# Patient Record
Sex: Female | Born: 1935 | Race: White | Hispanic: No
Health system: Southern US, Community
[De-identification: ages and names within clinical notes are randomized; demographics above are authoritative.]

---

## 2004-04-11 ENCOUNTER — Ambulatory Visit: Payer: Self-pay | Admitting: Oncology

## 2004-09-19 ENCOUNTER — Ambulatory Visit: Payer: Self-pay | Admitting: Oncology

## 2004-10-15 ENCOUNTER — Ambulatory Visit: Payer: Self-pay | Admitting: Oncology

## 2005-04-14 ENCOUNTER — Ambulatory Visit: Payer: Self-pay | Admitting: Oncology

## 2005-04-17 ENCOUNTER — Ambulatory Visit: Payer: Self-pay | Admitting: Oncology

## 2005-07-23 ENCOUNTER — Other Ambulatory Visit: Payer: Self-pay

## 2005-07-23 ENCOUNTER — Emergency Department: Payer: Self-pay | Admitting: Emergency Medicine

## 2005-09-19 ENCOUNTER — Ambulatory Visit: Payer: Self-pay | Admitting: Oncology

## 2005-12-17 ENCOUNTER — Emergency Department: Payer: Self-pay | Admitting: Emergency Medicine

## 2005-12-27 ENCOUNTER — Emergency Department: Payer: Self-pay | Admitting: Unknown Physician Specialty

## 2006-03-07 ENCOUNTER — Emergency Department: Payer: Self-pay | Admitting: Emergency Medicine

## 2006-03-07 ENCOUNTER — Other Ambulatory Visit: Payer: Self-pay

## 2006-04-16 ENCOUNTER — Emergency Department: Payer: Self-pay | Admitting: Internal Medicine

## 2006-05-16 ENCOUNTER — Emergency Department: Payer: Self-pay | Admitting: Emergency Medicine

## 2006-10-16 ENCOUNTER — Ambulatory Visit: Payer: Self-pay | Admitting: Oncology

## 2006-10-26 ENCOUNTER — Ambulatory Visit: Payer: Self-pay | Admitting: Oncology

## 2007-04-18 ENCOUNTER — Ambulatory Visit: Payer: Self-pay | Admitting: Oncology

## 2007-05-16 ENCOUNTER — Ambulatory Visit: Payer: Self-pay | Admitting: Oncology

## 2007-11-11 ENCOUNTER — Ambulatory Visit: Payer: Self-pay | Admitting: Oncology

## 2007-11-16 ENCOUNTER — Ambulatory Visit: Payer: Self-pay | Admitting: Oncology

## 2008-04-17 ENCOUNTER — Ambulatory Visit: Payer: Self-pay | Admitting: Oncology

## 2008-05-04 ENCOUNTER — Ambulatory Visit: Payer: Self-pay | Admitting: Oncology

## 2008-05-11 ENCOUNTER — Ambulatory Visit: Payer: Self-pay | Admitting: Oncology

## 2008-05-15 ENCOUNTER — Ambulatory Visit: Payer: Self-pay | Admitting: Oncology

## 2008-10-15 ENCOUNTER — Ambulatory Visit: Payer: Self-pay | Admitting: Oncology

## 2008-11-08 ENCOUNTER — Ambulatory Visit: Payer: Self-pay | Admitting: Oncology

## 2008-11-15 ENCOUNTER — Ambulatory Visit: Payer: Self-pay | Admitting: Oncology

## 2009-06-15 ENCOUNTER — Ambulatory Visit: Payer: Self-pay | Admitting: Oncology

## 2009-07-05 ENCOUNTER — Ambulatory Visit: Payer: Self-pay | Admitting: Oncology

## 2009-07-12 ENCOUNTER — Ambulatory Visit: Payer: Self-pay | Admitting: Oncology

## 2010-01-22 ENCOUNTER — Ambulatory Visit: Payer: Self-pay | Admitting: Oncology

## 2010-02-14 ENCOUNTER — Ambulatory Visit: Payer: Self-pay | Admitting: Oncology

## 2010-09-19 ENCOUNTER — Ambulatory Visit: Payer: Self-pay | Admitting: Oncology

## 2010-10-16 ENCOUNTER — Ambulatory Visit: Payer: Self-pay | Admitting: Oncology

## 2011-03-28 ENCOUNTER — Emergency Department: Payer: Self-pay | Admitting: Emergency Medicine

## 2011-03-30 ENCOUNTER — Emergency Department: Payer: Self-pay | Admitting: *Deleted

## 2011-05-14 ENCOUNTER — Inpatient Hospital Stay: Payer: Self-pay | Admitting: Internal Medicine

## 2011-05-14 LAB — URINALYSIS, COMPLETE
Blood: NEGATIVE
Glucose,UR: NEGATIVE mg/dL (ref 0–75)
Leukocyte Esterase: NEGATIVE
Nitrite: NEGATIVE
Ph: 8 (ref 4.5–8.0)
Specific Gravity: 1.01 (ref 1.003–1.030)
Squamous Epithelial: NONE SEEN
WBC UR: NONE SEEN /HPF (ref 0–5)

## 2011-05-14 LAB — COMPREHENSIVE METABOLIC PANEL
Anion Gap: 16 (ref 7–16)
BUN: 10 mg/dL (ref 7–18)
Bilirubin,Total: 0.5 mg/dL (ref 0.2–1.0)
Chloride: 81 mmol/L — ABNORMAL LOW (ref 98–107)
Creatinine: 0.72 mg/dL (ref 0.60–1.30)
EGFR (African American): >60
EGFR (Non-African Amer.): >60
Glucose: 183 mg/dL — ABNORMAL HIGH (ref 65–99)
Osmolality: 255 (ref 275–301)
Potassium: 2.9 mmol/L — ABNORMAL LOW (ref 3.5–5.1)
SGPT (ALT): 16 U/L
Sodium: 125 mmol/L — ABNORMAL LOW (ref 136–145)
Total Protein: 7.9 g/dL (ref 6.4–8.2)

## 2011-05-14 LAB — CBC
HCT: 41.5 % (ref 35.0–47.0)
HGB: 14.3 g/dL (ref 12.0–16.0)
MCH: 30.8 pg (ref 26.0–34.0)
MCHC: 34.5 g/dL (ref 32.0–36.0)
Platelet: 327 10*3/uL (ref 150–440)
RDW: 13.6 % (ref 11.5–14.5)

## 2011-05-14 LAB — BASIC METABOLIC PANEL
Anion Gap: 12 (ref 7–16)
BUN: 7 mg/dL (ref 7–18)
Chloride: 90 mmol/L — ABNORMAL LOW (ref 98–107)
EGFR (Non-African Amer.): >60
Potassium: 3.5 mmol/L (ref 3.5–5.1)

## 2011-05-15 LAB — BASIC METABOLIC PANEL
Anion Gap: 12 (ref 7–16)
Anion Gap: 12 (ref 7–16)
BUN: 6 mg/dL — ABNORMAL LOW (ref 7–18)
BUN: 5 mg/dL — ABNORMAL LOW (ref 7–18)
BUN: 6 mg/dL — ABNORMAL LOW (ref 7–18)
Calcium, Total: 9.5 mg/dL (ref 8.5–10.1)
Calcium, Total: 8.7 mg/dL (ref 8.5–10.1)
Chloride: 96 mmol/L — ABNORMAL LOW (ref 98–107)
Chloride: 98 mmol/L (ref 98–107)
Co2: 27 mmol/L (ref 21–32)
Co2: 27 mmol/L (ref 21–32)
Creatinine: 0.65 mg/dL (ref 0.60–1.30)
Creatinine: 0.58 mg/dL — ABNORMAL LOW (ref 0.60–1.30)
EGFR (African American): >60
EGFR (African American): >60
EGFR (Non-African Amer.): >60
Glucose: 107 mg/dL — ABNORMAL HIGH (ref 65–99)
Glucose: 113 mg/dL — ABNORMAL HIGH (ref 65–99)
Osmolality: 264 (ref 275–301)
Osmolality: 266 (ref 275–301)
Potassium: 3.3 mmol/L — ABNORMAL LOW (ref 3.5–5.1)
Potassium: 3.5 mmol/L (ref 3.5–5.1)
Potassium: 3.6 mmol/L (ref 3.5–5.1)
Sodium: 133 mmol/L — ABNORMAL LOW (ref 136–145)

## 2011-05-16 ENCOUNTER — Ambulatory Visit: Payer: Self-pay | Admitting: Internal Medicine

## 2011-05-16 LAB — CBC WITH DIFFERENTIAL/PLATELET
Basophil #: 0 10*3/uL (ref 0.0–0.1)
Eosinophil %: 0 %
HCT: 35.6 % (ref 35.0–47.0)
HGB: 12 g/dL (ref 12.0–16.0)
Lymphocyte #: 2 10*3/uL (ref 1.0–3.6)
Lymphocyte %: 20 %
Monocyte #: 0.7 10*3/uL (ref 0.0–0.7)
Monocyte %: 6.5 %
Neutrophil #: 7.4 10*3/uL — ABNORMAL HIGH (ref 1.4–6.5)
Neutrophil %: 73.3 %
Platelet: 254 10*3/uL (ref 150–440)
RDW: 13.5 % (ref 11.5–14.5)
WBC: 10.1 10*3/uL (ref 3.6–11.0)

## 2011-05-16 LAB — BASIC METABOLIC PANEL
Anion Gap: 12 (ref 7–16)
BUN: 7 mg/dL (ref 7–18)
Chloride: 102 mmol/L (ref 98–107)
Co2: 23 mmol/L (ref 21–32)
Creatinine: 0.61 mg/dL (ref 0.60–1.30)
Osmolality: 271 (ref 275–301)
Potassium: 3.5 mmol/L (ref 3.5–5.1)

## 2011-05-17 LAB — BASIC METABOLIC PANEL
Anion Gap: 14 (ref 7–16)
BUN: 14 mg/dL (ref 7–18)
Calcium, Total: 9.1 mg/dL (ref 8.5–10.1)
Chloride: 102 mmol/L (ref 98–107)
Co2: 23 mmol/L (ref 21–32)
Creatinine: 0.58 mg/dL — ABNORMAL LOW (ref 0.60–1.30)
EGFR (Non-African Amer.): >60
Sodium: 139 mmol/L (ref 136–145)

## 2011-05-18 LAB — BASIC METABOLIC PANEL
BUN: 13 mg/dL (ref 7–18)
Calcium, Total: 9 mg/dL (ref 8.5–10.1)
Co2: 21 mmol/L (ref 21–32)
Glucose: 58 mg/dL — ABNORMAL LOW (ref 65–99)
Osmolality: 275 (ref 275–301)
Potassium: 3.2 mmol/L — ABNORMAL LOW (ref 3.5–5.1)
Sodium: 139 mmol/L (ref 136–145)

## 2011-06-01 ENCOUNTER — Inpatient Hospital Stay: Payer: Self-pay | Admitting: Internal Medicine

## 2011-06-01 LAB — URINALYSIS, COMPLETE
Glucose,UR: NEGATIVE mg/dL (ref 0–75)
Nitrite: POSITIVE
Ph: 8 (ref 4.5–8.0)
Protein: 100
RBC,UR: 18 /HPF (ref 0–5)
Specific Gravity: 1.012 (ref 1.003–1.030)
WBC UR: 147 /HPF (ref 0–5)

## 2011-06-01 LAB — CBC
HCT: 41.3 % (ref 35.0–47.0)
MCH: 30.7 pg (ref 26.0–34.0)
MCHC: 33.4 g/dL (ref 32.0–36.0)
RBC: 4.5 10*6/uL (ref 3.80–5.20)

## 2011-06-01 LAB — COMPREHENSIVE METABOLIC PANEL
Albumin: 2.6 g/dL — ABNORMAL LOW (ref 3.4–5.0)
Bilirubin,Total: 0.9 mg/dL (ref 0.2–1.0)
Calcium, Total: 9.5 mg/dL (ref 8.5–10.1)
EGFR (African American): 49 — ABNORMAL LOW
Glucose: 130 mg/dL — ABNORMAL HIGH (ref 65–99)
Osmolality: 334 (ref 275–301)
Potassium: 3.7 mmol/L (ref 3.5–5.1)
SGOT(AST): 11 U/L — ABNORMAL LOW (ref 15–37)
SGPT (ALT): 17 U/L
Sodium: 153 mmol/L — ABNORMAL HIGH (ref 136–145)

## 2011-06-01 LAB — BASIC METABOLIC PANEL
BUN: 81 mg/dL — ABNORMAL HIGH (ref 7–18)
Calcium, Total: 9.3 mg/dL (ref 8.5–10.1)
Chloride: 120 mmol/L — ABNORMAL HIGH (ref 98–107)
EGFR (Non-African Amer.): 51 — ABNORMAL LOW
Glucose: 135 mg/dL — ABNORMAL HIGH (ref 65–99)
Osmolality: 324 (ref 275–301)
Potassium: 3.4 mmol/L — ABNORMAL LOW (ref 3.5–5.1)
Sodium: 150 mmol/L — ABNORMAL HIGH (ref 136–145)

## 2011-06-02 LAB — CBC WITH DIFFERENTIAL/PLATELET
Basophil #: 0 10*3/uL (ref 0.0–0.1)
Basophil %: 0.1 %
Eosinophil #: 0 10*3/uL (ref 0.0–0.7)
HCT: 36.4 % (ref 35.0–47.0)
HGB: 12.1 g/dL (ref 12.0–16.0)
Lymphocyte #: 0.8 10*3/uL — ABNORMAL LOW (ref 1.0–3.6)
MCH: 30.3 pg (ref 26.0–34.0)
MCHC: 33.2 g/dL (ref 32.0–36.0)
MCV: 91 fL (ref 80–100)
Monocyte #: 0.3 10*3/uL (ref 0.0–0.7)
Neutrophil #: 14.1 10*3/uL — ABNORMAL HIGH (ref 1.4–6.5)

## 2011-06-02 LAB — BASIC METABOLIC PANEL
Anion Gap: 11 (ref 7–16)
BUN: 66 mg/dL — ABNORMAL HIGH (ref 7–18)
Chloride: 121 mmol/L — ABNORMAL HIGH (ref 98–107)
Creatinine: 1.05 mg/dL (ref 0.60–1.30)
Osmolality: 330 (ref 275–301)
Potassium: 3.3 mmol/L — ABNORMAL LOW (ref 3.5–5.1)
Sodium: 156 mmol/L — ABNORMAL HIGH (ref 136–145)

## 2011-06-03 LAB — CBC WITH DIFFERENTIAL/PLATELET
Basophil #: 0 10*3/uL (ref 0.0–0.1)
Eosinophil #: 0 10*3/uL (ref 0.0–0.7)
Eosinophil %: 0.2 %
HCT: 36 % (ref 35.0–47.0)
HGB: 12 g/dL (ref 12.0–16.0)
Lymphocyte #: 0.9 10*3/uL — ABNORMAL LOW (ref 1.0–3.6)
Lymphocyte %: 5.2 %
MCV: 91 fL (ref 80–100)
Monocyte %: 1.8 %
Neutrophil #: 15.4 10*3/uL — ABNORMAL HIGH (ref 1.4–6.5)
Platelet: 387 10*3/uL (ref 150–440)
RDW: 15.6 % — ABNORMAL HIGH (ref 11.5–14.5)

## 2011-06-03 LAB — BASIC METABOLIC PANEL
Anion Gap: 13 (ref 7–16)
BUN: 52 mg/dL — ABNORMAL HIGH (ref 7–18)
Calcium, Total: 9.1 mg/dL (ref 8.5–10.1)
Co2: 23 mmol/L (ref 21–32)
Creatinine: 0.96 mg/dL (ref 0.60–1.30)
EGFR (African American): >60
EGFR (Non-African Amer.): 59 — ABNORMAL LOW
Sodium: 156 mmol/L — ABNORMAL HIGH (ref 136–145)

## 2011-06-04 LAB — CBC WITH DIFFERENTIAL/PLATELET
Basophil #: 0 10*3/uL (ref 0.0–0.1)
Eosinophil #: 0.1 10*3/uL (ref 0.0–0.7)
Eosinophil %: 0.5 %
HGB: 11.7 g/dL — ABNORMAL LOW (ref 12.0–16.0)
Lymphocyte #: 1 10*3/uL (ref 1.0–3.6)
MCV: 91 fL (ref 80–100)
Monocyte #: 0.4 10*3/uL (ref 0.0–0.7)
Monocyte %: 2.9 %
Neutrophil #: 10.8 10*3/uL — ABNORMAL HIGH (ref 1.4–6.5)
Neutrophil %: 88.3 %
Platelet: 362 10*3/uL (ref 150–440)
RBC: 3.94 10*6/uL (ref 3.80–5.20)
RDW: 15.5 % — ABNORMAL HIGH (ref 11.5–14.5)
WBC: 12.2 10*3/uL — ABNORMAL HIGH (ref 3.6–11.0)

## 2011-06-04 LAB — BASIC METABOLIC PANEL
Anion Gap: 8 (ref 7–16)
BUN: 33 mg/dL — ABNORMAL HIGH (ref 7–18)
Calcium, Total: 9.3 mg/dL (ref 8.5–10.1)
Chloride: 118 mmol/L — ABNORMAL HIGH (ref 98–107)
Glucose: 95 mg/dL (ref 65–99)
Osmolality: 305 (ref 275–301)
Potassium: 3.7 mmol/L (ref 3.5–5.1)

## 2011-06-05 LAB — BASIC METABOLIC PANEL
BUN: 22 mg/dL — ABNORMAL HIGH (ref 7–18)
Calcium, Total: 8.9 mg/dL (ref 8.5–10.1)
Chloride: 113 mmol/L — ABNORMAL HIGH (ref 98–107)
Creatinine: 0.66 mg/dL (ref 0.60–1.30)
EGFR (African American): >60
EGFR (Non-African Amer.): >60
Osmolality: 293 (ref 275–301)
Potassium: 3.4 mmol/L — ABNORMAL LOW (ref 3.5–5.1)
Sodium: 146 mmol/L — ABNORMAL HIGH (ref 136–145)

## 2011-06-06 LAB — CBC WITH DIFFERENTIAL/PLATELET
Basophil #: 0 10*3/uL (ref 0.0–0.1)
Basophil %: 0.1 %
Eosinophil #: 0 10*3/uL (ref 0.0–0.7)
HCT: 36 % (ref 35.0–47.0)
HGB: 12 g/dL (ref 12.0–16.0)
Lymphocyte %: 6 %
MCHC: 33.5 g/dL (ref 32.0–36.0)
Monocyte %: 1.5 %
Neutrophil #: 7.8 10*3/uL — ABNORMAL HIGH (ref 1.4–6.5)
Neutrophil %: 92.4 %
Platelet: 303 10*3/uL (ref 150–440)
RBC: 4.01 10*6/uL (ref 3.80–5.20)
RDW: 15.2 % — ABNORMAL HIGH (ref 11.5–14.5)

## 2011-06-06 LAB — BASIC METABOLIC PANEL
Anion Gap: 18 — ABNORMAL HIGH (ref 7–16)
BUN: 26 mg/dL — ABNORMAL HIGH (ref 7–18)
Calcium, Total: 8.8 mg/dL (ref 8.5–10.1)
Chloride: 110 mmol/L — ABNORMAL HIGH (ref 98–107)
Co2: 23 mmol/L (ref 21–32)
Creatinine: 0.92 mg/dL (ref 0.60–1.30)
EGFR (African American): >60
Glucose: 122 mg/dL — ABNORMAL HIGH (ref 65–99)
Potassium: 2.9 mmol/L — ABNORMAL LOW (ref 3.5–5.1)
Sodium: 151 mmol/L — ABNORMAL HIGH (ref 136–145)

## 2011-06-06 LAB — MAGNESIUM: Magnesium: 1.7 mg/dL — ABNORMAL LOW

## 2011-06-06 LAB — PHOSPHORUS: Phosphorus: 3.3 mg/dL (ref 2.5–4.9)

## 2011-06-07 LAB — URINE CULTURE

## 2011-06-07 LAB — PHOSPHORUS: Phosphorus: 2.2 mg/dL — ABNORMAL LOW (ref 2.5–4.9)

## 2011-06-07 LAB — SODIUM: Sodium: 153 mmol/L — ABNORMAL HIGH (ref 136–145)

## 2011-06-07 LAB — CHLORIDE: Chloride: 116 mmol/L — ABNORMAL HIGH (ref 98–107)

## 2011-06-08 LAB — BASIC METABOLIC PANEL
Calcium, Total: 8.6 mg/dL (ref 8.5–10.1)
Co2: 22 mmol/L (ref 21–32)
Creatinine: 0.72 mg/dL (ref 0.60–1.30)
EGFR (African American): >60
EGFR (Non-African Amer.): >60
Glucose: 113 mg/dL — ABNORMAL HIGH (ref 65–99)

## 2011-06-08 LAB — SODIUM: Sodium: 152 mmol/L — ABNORMAL HIGH (ref 136–145)

## 2011-06-08 LAB — PHOSPHORUS: Phosphorus: 1.9 mg/dL — ABNORMAL LOW (ref 2.5–4.9)

## 2011-06-09 LAB — BASIC METABOLIC PANEL
BUN: 33 mg/dL — ABNORMAL HIGH (ref 7–18)
Calcium, Total: 8.1 mg/dL — ABNORMAL LOW (ref 8.5–10.1)
Chloride: 110 mmol/L — ABNORMAL HIGH (ref 98–107)
Creatinine: 0.7 mg/dL (ref 0.60–1.30)
EGFR (Non-African Amer.): >60
Glucose: 200 mg/dL — ABNORMAL HIGH (ref 65–99)
Osmolality: 294 (ref 275–301)

## 2011-06-09 LAB — PHOSPHORUS: Phosphorus: 1.5 mg/dL — ABNORMAL LOW (ref 2.5–4.9)

## 2011-06-09 LAB — MAGNESIUM: Magnesium: 1.6 mg/dL — ABNORMAL LOW

## 2011-06-10 LAB — BASIC METABOLIC PANEL
Anion Gap: 14 (ref 7–16)
BUN: 29 mg/dL — ABNORMAL HIGH (ref 7–18)
Calcium, Total: 8.2 mg/dL — ABNORMAL LOW (ref 8.5–10.1)
Chloride: 100 mmol/L (ref 98–107)
Creatinine: 0.79 mg/dL (ref 0.60–1.30)
EGFR (Non-African Amer.): >60
Osmolality: 275 (ref 275–301)
Potassium: 4 mmol/L (ref 3.5–5.1)
Sodium: 131 mmol/L — ABNORMAL LOW (ref 136–145)

## 2011-06-10 LAB — CBC WITH DIFFERENTIAL/PLATELET
Basophil %: 0.1 %
Eosinophil %: 0 %
HCT: 40 % (ref 35.0–47.0)
HGB: 13.5 g/dL (ref 12.0–16.0)
Lymphocyte #: 1.3 10*3/uL (ref 1.0–3.6)
Lymphocyte %: 7.1 %
MCH: 30.2 pg (ref 26.0–34.0)
MCHC: 33.8 g/dL (ref 32.0–36.0)
MCV: 89 fL (ref 80–100)
Monocyte #: 0.3 10*3/uL (ref 0.0–0.7)
Monocyte %: 1.5 %
Neutrophil #: 16.1 10*3/uL — ABNORMAL HIGH (ref 1.4–6.5)
Neutrophil %: 91.3 %
Platelet: 315 10*3/uL (ref 150–440)
RDW: 15.3 % — ABNORMAL HIGH (ref 11.5–14.5)
WBC: 17.6 10*3/uL — ABNORMAL HIGH (ref 3.6–11.0)

## 2011-06-10 LAB — PHOSPHORUS: Phosphorus: 2.5 mg/dL (ref 2.5–4.9)

## 2011-06-16 ENCOUNTER — Ambulatory Visit: Payer: Self-pay | Admitting: Internal Medicine

## 2011-07-16 DEATH — deceased

## 2013-07-29 IMAGING — CR DG ABDOMEN 3V
1 series · 3 of 3 positions shown · non-contrast
Comparison: none

REASON FOR EXAM: Pain, constipation, vomiting
COMMENTS:   May transport without cardiac monitor

[Series 1: x abdomen supine · 0.14mm/px · 3 of 3 slices shown]
[im 1/3]
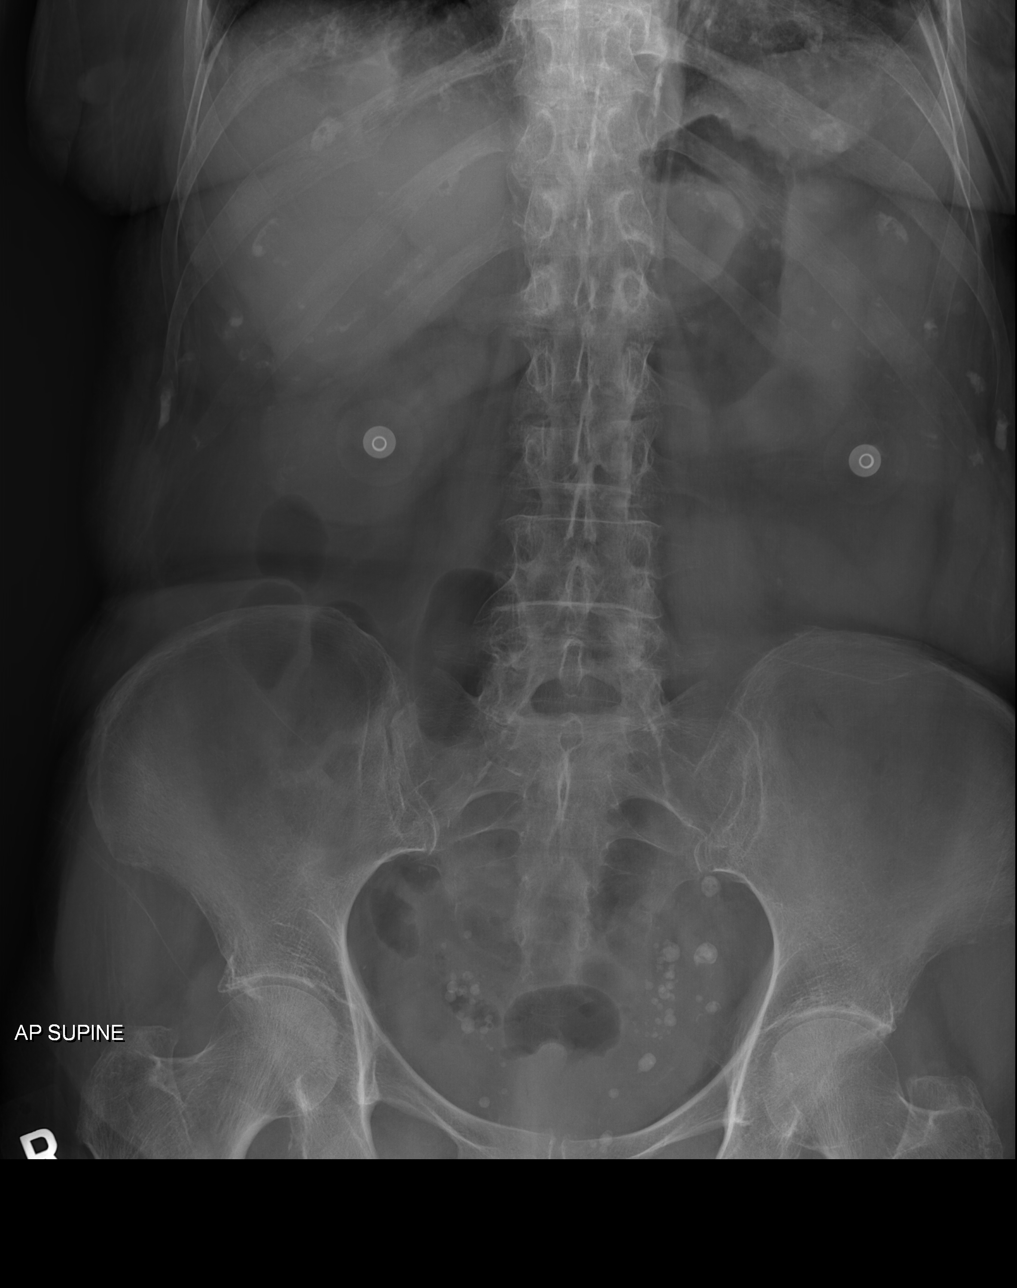
[im 2/3]
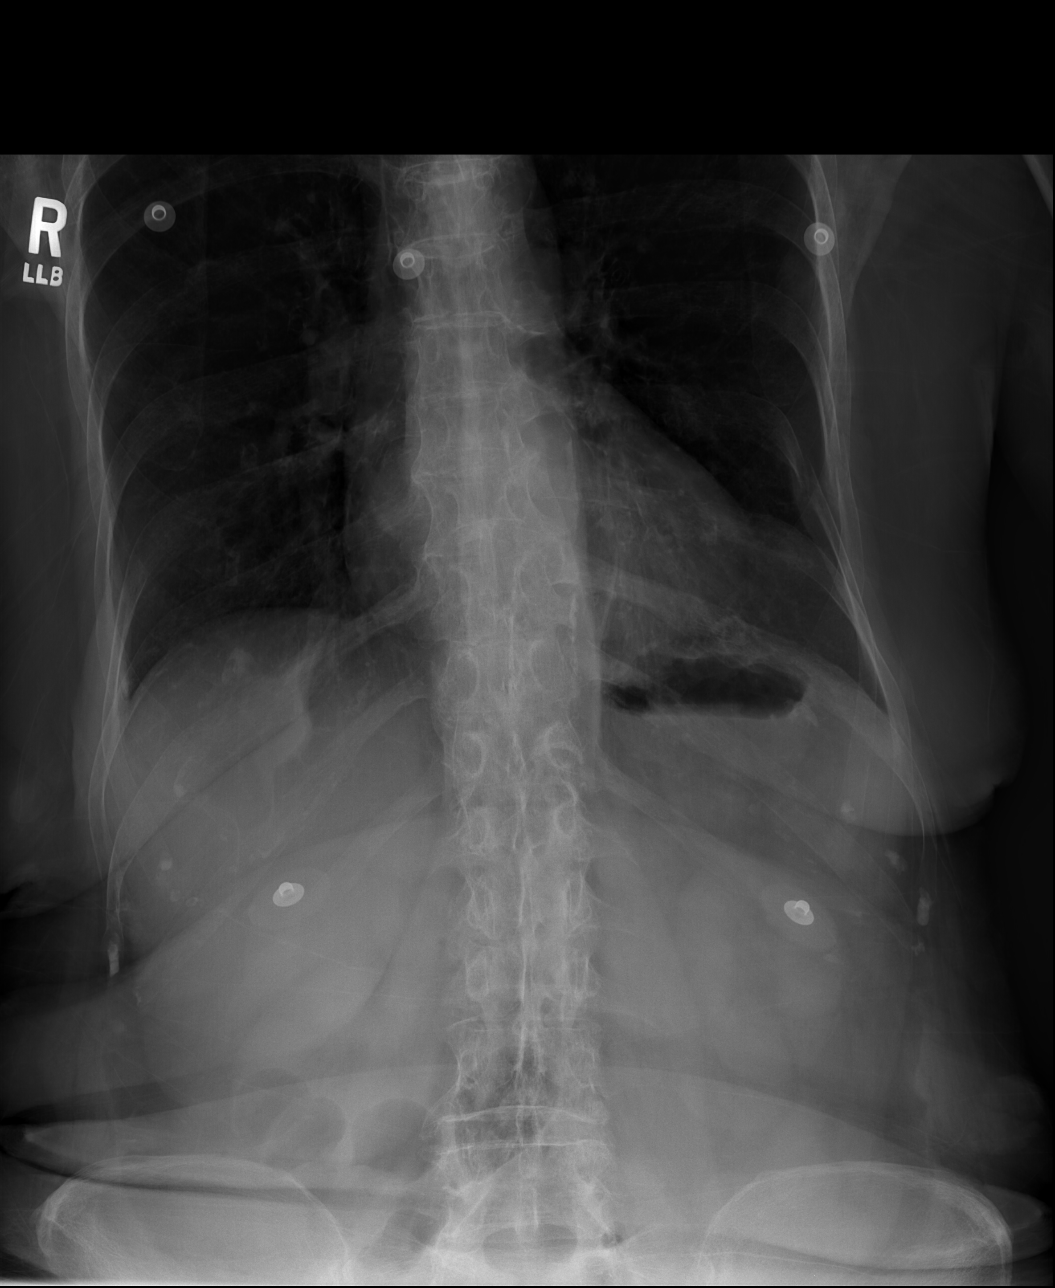
[im 3/3]
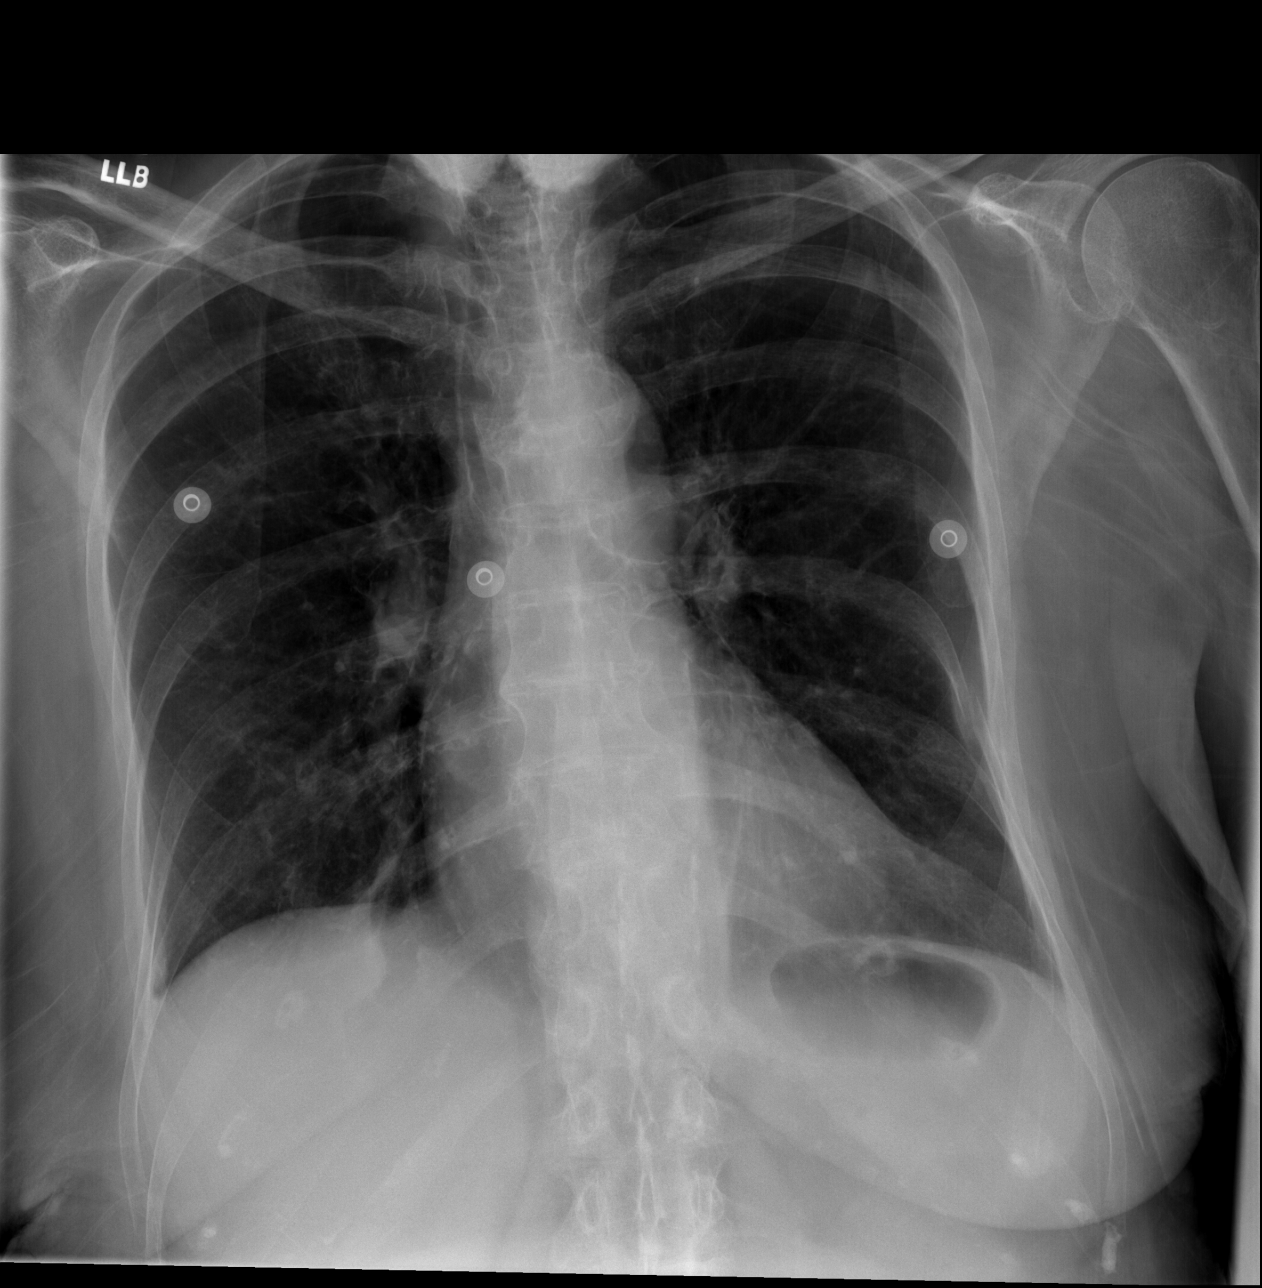

[3 of 3 positions shown; findings below may reference images not displayed]

PROCEDURE:     DXR - DXR ABDOMEN 3-WAY (INCL PA CXR)  - May 14, 2011 [DATE]

RESULT:     PA view of the chest shows the lung fields to be clear. Flat and
erect views of the abdomen were obtained. No subdiaphragmatic free air is
seen. The bowel gas pattern shows no specific abnormalities. There are
multiple calcifications in the pelvis compatible with phleboliths. No acute
bony abnormalities are seen.
IMPRESSION: 1.     No acute changes are identified.

## 2014-07-09 NOTE — H&P (Signed)
PATIENT NAME:  Mercedes Jackson, Mercedes Jackson MR#:  [id number] DATE OF BIRTH:  [birth year]  DATE OF ADMISSION:  05/14/2011  PRIMARY CARE PHYSICIAN: Toy Cookey, MD  ER PHYSICIAN: Dana Allan, MD  CHIEF COMPLAINT: Nausea and vomiting.   HISTORY OF PRESENT ILLNESS: This is an 81 year old female with history of hypertension and history of recent rib fractures who came in because of abdominal pain and vomiting. The patient says her abdomen is sore. She did not have pain but feels sore and has been vomiting since last night. The patient's daughter is giving all the information. According to her, the patient had multiple episodes of vomiting last night and decreased p.o. intake and had a bowel movement yesterday with stool softeners. The patient right now had a small episode of vomiting just before I saw the patient, but otherwise denies any complaints and has no fever.   PAST MEDICAL HISTORY:  1. 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 She follows up with Dr. Johney Maine every year.  2. History of hypertension.  3. The patient had a recent fall and suffered rib fractures, in early January. The patient was started on pain medications, which is Percocet. According to the daughter, that made her very nauseous and also sedated, so she was taken off Percocet and started on Lidoderm patch.   DRUG ALLERGIES: Percocet and sulfa.   SOCIAL HISTORY: No smoking. No drinking. Lives with daughter.   FAMILY HISTORY: No hypertension or diabetes.   PAST SURGICAL HISTORY: Total abdominal hysterectomy.   DISCHARGE MEDICATIONS:  1. Lidoderm patch. 2. HCTZ 25/320 mg p.o. daily.   REVIEW OF SYSTEMS: CONSTITUTIONAL: No fever. No fatigue. EYES: No blurred vision.  ENT: No tinnitus. Hard of hearing. No difficulty swallowing. RESPIRATORY: Has no cough. No wheezing. CARDIOVASCULAR: No chest pain. No palpitations. GASTROINTESTINAL: Had nausea, vomiting, and abdominal  pain today. No gastroesophageal reflux disease. GENITOURINARY: Has no dysuria. ENDOCRINE: No polyuria or nocturia. INTEGUMENT: No skin rashes. The patient does have skin breakdown in the buttock area. NEUROLOGIC: No weakness. MUSCULOSKELETAL: Has pain in the ribs, gets Lidoderm patch for that. PSYCH: No anxiety or insomnia.   PHYSICAL EXAMINATION:   VITALS: Temperature 96.8, pulse 108, respirations 20, blood pressure 174/86; repeat is 157/70.  GENERAL: Alert, awake, and oriented x3, not in distress, able to answer questions appropriately.   HEAD: Atraumatic, normocephalic.  EYES:  Pupils are equally reacting to light. Extraocular muscles intact.   ENT: No tympanic membrane congestion. No turbinate hypertrophy. No oropharyngeal erythema.   NECK: Normal range of motion. No JVD. No carotid bruit.   CARDIOVASCULAR: S1 and S2 regular. No murmurs. PMI nondisplaced.   RESPIRATORY: Lungs are clear to auscultation. No wheeze. No rales.   ABDOMEN: Soft, nontender, and nondistended. Bowel sounds present.   EXTREMITIES: Power 5/5 in upper and lower extremities.   SKIN: No skin rashes.   NEUROLOGIC: Cranial nerves II through XII grossly intact. Sensation is intact. No dysarthria or aphasia.   LABS/STUDIES: EKG showed sinus tachycardia with left bundle branch block.  White count 14.2, hemoglobin 14.3, hematocrit 41.5, platelets 327.  Abdominal x-ray showed no acute changes are identified. Bowel gas pattern is showing no specific abnormality. Multiple calcifications are present in the pelvic area consistent with phlebolith. No acute abnormalities seen.   Electrolytes showed sodium of 125, potassium 2.9, chloride 81, bicarbonate 28, BUN 10, creatinine 0.72, and glucose 183,000.   Urinalysis showed trace bacteria, leukocyte esterase negative, and nitrites are  negative.   ASSESSMENT AND PLAN:  1. The patient is an 81 year old female with hypertension and history of laryngeal cancer with recent  rib fractures with Lidoderm patch on who came in because of nausea and vomiting. The patient has hyponatremia and hypokalemia, likely secondary to vomiting. The patient will be admitted to the hospitalist service. We will give normal saline along with potassium. The patient has hyponatremia, likely due to dehydration with vomiting, so we will recheck the BMP every eight hours and replace the IV fluids as needed.  2. The patient had recent fall and chest x-ray showed rib fracture of left 8th ribs bilaterally. CT of the head on 03/28/2011 showed no acute changes.continue lidoderm patch  3. Hypertension. Hold HCTZ and losartan due to hyponatremia and hypokalemia and the patient will be started on Norvasc for blood pressure. 4. GI prophylaxis and deep vein thrombosis prophylaxis.  5. Deconditioning with falls. The patient will be seen by a physical therapist and the daughter is leaning towards nursing home placement.   discussed with daughter  TIME SPENT: 60 minutes.  ____________________________ Katha HammingSnehalatha Daymond Cordts, MD sk:slb D: 05/14/2011 13:57:16 ET     T: 05/14/2011 14:43:43 ET       JOB#: [id number] cc: Katha HammingSnehalatha Kimberlynn Lumbra, MD, <Dictator> Serita ShellerErnest B. Maryellen PileEason, MD Katha HammingSNEHALATHA Mylo Choi MD ELECTRONICALLY SIGNED 05/15/2011 17:44

## 2014-07-09 NOTE — H&P (Signed)
PATIENT NAME:  Mercedes Jackson, Mercedes Jackson MR#:  [id number] DATE OF BIRTH:  [birth year]  DATE OF ADMISSION:  06/01/2011  PRIMARY CARE PHYSICIAN: Mercedes CookeyErnest Eason, MD  CHIEF COMPLAINT: Unable to swallow and nausea and vomiting. The patient is a poor historian. No family is in the emergency room. The remaining history is obtained from the emergency room physician and old records.   HISTORY OF PRESENT ILLNESS: Mercedes Jackson is an 81 year old Caucasian female who was just admitted on 02/27 through 05/19/2011 with hyponatremia and dehydration. She was discharged to [facility]. She comes to the Emergency Room with complaints of nausea, vomiting, and unable to keep anything Jackson.o. She also was noted to have poor Jackson.o. intake and decreased activity per the staff. She was quite tachycardic with heart rate in the 118s and saturations were 92% on room air. In the Emergency Room, she was found to have a urinary tract infection along with early right lower lobe pneumonia. She is being admitted with SIRS due to urinary tract infection and pneumonia. The patient received a dose of Rocephin in the Emergency Room. Urine culture has been sent. Blood cultures have not been sent by the emergency room physician. I will be ordering some blood cultures.   PAST MEDICAL HISTORY:  1. Laryngeal cancer with metastasis to the lymph nodes, status post chemotherapy, now in remission, follows up with Mercedes Jackson every year.  2. History of hypertension.  3. The patient recently had a fall and suffered rib fractures, in early January. She was started on pain medications with Percocet. She is not able to tolerate it and hence is now on Lidoderm patch. 4. Recently admitted with hyponatremia and acute gastroenteritis and discharged 05/19/2011 to [facility]. The patient at that time was discharged on a pureed diet.  5. Possible dementia, likely Alzheimer's.  6. Gastroesophageal reflux disease.   SOCIAL HISTORY: She comes from  [facility]. No smoking. No drinking.   FAMILY HISTORY: No hypertension or diabetes.   PAST SURGICAL HISTORY: Total abdominal hysterectomy.   MEDICATIONS: (According to the nursing home) 1. Delsym 30 mg/5 mL, 5 mL twice a day.  2. Diovan 160 mg Jackson.o. daily.  3. Duke's Magic mouthwash 5 mL four times daily. 4. Ensure liquid twice a day.  5. Lidoderm patch 5% daily. 6. Megace 10 mL (40 mg/mL) 400 mg Jackson.o. daily for appetite stimulant.  7. Potassium chloride 20 milliequivalents daily.  8. Protonix 40 mg daily.  9. Trazodone 100 mg Jackson.o. at bedtime.  10. Ultram 50 mg one tablet every 6 hours Jackson.o. Jackson.r.n. for pain.  REVIEW OF SYSTEMS: Limited given the patient being a poor historian and fatigued. CONSTITUTIONAL: Positive for fatigue and weakness. EYES: No blurred or double vision. No glaucoma. ENT: No tinnitus, ear pain, or hearing loss. RESPIRATORY: Positive for some cough. No wheeze or hemoptysis. CARDIOVASCULAR: No chest pain, orthopnea, or edema. GASTROINTESTINAL: Positive for nausea, vomiting, and gastroesophageal reflux disease. GENITOURINARY: No dysuria or hematuria. ENDOCRINE: No polyuria or nocturia. HEMATOLOGY: No anemia or easy bruising. SKIN: No acne or rash. MUSCULOSKELETAL: Positive for arthritis. NEUROLOGIC: No cerebrovascular accident or transient ischemic attack. PSYCH: Dementia. All other systems reviewed and negative.  PHYSICAL EXAMINATION:   GENERAL: The patient appears very weak and fatigued, thin and frail.  VITALS: Temperature 98.4, pulse 114, blood pressure 186/88, and saturations are 93% on 2 liters.   HEENT: Atraumatic, normocephalic. Pupils are equal, round, and reactive to light and accommodation. Extraocular movements intact. Oral mucosa is moist.  The patient does have some greenish secretions present.  NECK: Supple. No JVD. No carotid bruit.   RESPIRATORY: Decreased breath sounds in the bases. There are no crackles.   CARDIOVASCULAR: Tachycardia  present. No murmur heard. PMI is not lateralized. Chest is nontender.   ABDOMEN: Soft, benign, and nontender. No mass felt. There is hyperactive bowel sounds.   EXTREMITIES: No edema. Good pedal pulses. Good femoral pulses.   SKIN: Warm and dry.  NEUROLOGIC: The patient is weak, although moves all extremities well. No focal neuro deficits.   LABS/STUDIES: Chest x-ray: Right basilar air space disease concerning for pneumonia.   Urinalysis is positive for significant urinary tract infection.   White count 16.2, hemoglobin and hematocrit 13.8 and 41.3, and platelet count is 471. Glucose 130, BUN 93, creatinine 1.3, sodium 153, potassium 3.7, chloride 115, and albumin is 2.6.   EKG shows sinus tachycardia and ST-T changes in lateral leads.   ASSESSMENT: 81 year old Ms. Gentile with: 1. Systemic antiinflammatory response syndrome due to right lower lobe pneumonia, suspected aspiration and urinary tract infection. The patient has a low grade fever,  tachycardia, and an elevated white count. She was also placed on a pureed diet during her last admission. She was evaluated by a speech therapist at that time.  2. Nausea and vomiting with dehydration.  3. Hypernatremia, hypochloremia due to dehydration.  4. Hypertension.  5. Failure to thrive.  6. Anxiety disorder.   PLAN:  1. Admit the patient to the medical floor with off unit telemetry.  2. NPO except medications.  3. Send blood cultures. It was not sent by the emergency room physician.  4. I will start the patient on Zithromax and Zosyn. 5. Send sputum for culture.  6. Follow up urine culture and sputum along with blood cultures.  7. We will continue home medications.  8. IV fluids with dextrose D5 half normal saline at 100 mL/hour.  9. Monitor ins and outs and creatinine.  10. If creatinine continues to rise, then discontinue Diovan. 11. We will give Jackson.r.n. hydralazine as needed for elevated blood pressure.  12. Heparin for deep  vein thrombosis prophylaxis.  13. Protonix for GI prophylaxis and PPI.   The hospital admission plan was discussed with the patient. There were no family members present in the ER. The patient remains a FULL CODE. Consider palliative care consultation given recurrent admissions and multiple medical problems.   I will order physical therapy, speech therapy, and care management for discharge planning.  TIME SPENT: 50 minutes. ____________________________ Wylie Hail Allena Katz, MD sap:slb D: 06/01/2011 08:11:15 ET T: 06/01/2011 11:14:58 ET JOB#: [id number]  cc: Haedyn Breau A. Allena Katz, MD, <Dictator> Serita Sheller. Maryellen Pile, MD Willow Ora MD ELECTRONICALLY SIGNED 06/01/2011 13:40

## 2014-07-09 NOTE — Discharge Summary (Signed)
PATIENT NAME:  Mercedes Jackson, Mercedes Jackson MR#:  [id number] DATE OF BIRTH:  04-Sep-1924  DATE OF ADMISSION:  05/14/2011 DATE OF DISCHARGE:  05/19/2011   ADMITTING DIAGNOSES: Nausea, vomiting, dehydration, generalized weakness.   DISCHARGE DIAGNOSES:  1. Nausea and vomiting likely due to acute gastroenteritis, now resolved.  2. Hyponatremia and hypokalemia due to vomiting, improved with replacement.  3. Dehydration status post IV hydration.  4. Recent rib fracture symptoms improved with Lidoderm patch.  5. Hypertension which is labile, gets intermittent elevation due to anxiety.  6. Generalized deconditioning, will be referred for rehab.  7. Gastroesophageal reflux disease, will be on PPIs.  8. Possible dementia, likely Alzheimer's.  9. History of laryngeal cancer with metastasis to the lymph nodes status post chemotherapy now in remission.   CONSULTANT: Case Management    PERTINENT LABORATORY EVALUATIONS: EKG on admission showed sinus tachycardia, left bundle branch block.   BMP showed sodium 125, potassium 2.9, chloride 81, CO2 28, glucose 183. Lipase 212. WBC 14.2, hemoglobin 14.3, platelet count 327.  Abdominal x-ray, three-view, was negative. Urinalysis showed nitrites negative, leukocytes negative.   Most recent BMP on March 3rd showed sodium 139, potassium 3.2, chloride 102. WBC on March 1st was 10.1, hemoglobin 12.   HOSPITAL COURSE: Please see history and physical done by the admitting physician. The patient is an 81 year old white female with history of hypertension, history of recent fall, lives with her daughter, came in with abdominal pain, nausea, and vomiting. The patient also was noted to have hyponatremia and hypokalemia. She was admitted for deconditioning, dehydration, and electrolyte imbalances as well as her nausea and vomiting. Her nausea and vomiting was felt to be due to acute gastroenteritis. It was treated supportively with slow improvement in her symptoms. The patient also was  seen by physical therapy for her generalized deconditioning. They recommended rehab. She is improved from that standpoint as well. She was also significantly dehydrated on presentation and was hydrated and now very well rehydrated and now eating and drinking. The patient does seem to have issue with anxiety and her blood pressure is labile and increases with anxiety. She is currently doing much better and is stable for discharge for further rehab.   DISCHARGE MEDICATIONS:  1. Lidoderm patch applied topically once daily, to remove after 12 hours a day. 2. Diovan 160 Jackson.o. daily.  3. Trazodone 100 Jackson.o. at bedtime.  4. Megace 400 Jackson.o. daily.  5. Protonix 40 daily.  6. KCL 20 mEq Jackson.o. daily x3 days.   DIET: Puree diet. One Ensure can t.i.d.   ACTIVITY: As tolerated.   REFERRALS: PT, OT, and rehab.  FOLLOW-UP: Follow-up with Dr. Maryellen PileEason in 1 to 2 weeks.   TIME SPENT: 35 minutes.    ____________________________ Lacie ScottsShreyang H. Allena KatzPatel, MD shp:drc D: 05/19/2011 11:55:11 ET T: 05/19/2011 12:24:39 ET JOB#: [id number]  cc: Kaaliyah Kita H. Allena KatzPatel, MD, <Dictator> Serita ShellerErnest B. Maryellen PileEason, MD Charise CarwinSHREYANG H Rosabella Edgin MD ELECTRONICALLY SIGNED 05/23/2011 7:53

## 2014-07-09 NOTE — Consult Note (Signed)
PATIENT NAME:  Mercedes Jackson, GREIF MR#:  [id number] DATE OF BIRTH:  September 13, 1924  DATE OF CONSULTATION:  06/04/2011  REFERRING PHYSICIAN:  Dr. Leslye Peer CONSULTING PHYSICIAN:  Dr. Eddie Dibbles Oh/ Payton Emerald, NP/ [facility]  PRIMARY CARE PHYSICIAN:  Dr. Nicky Pugh  REASON FOR CONSULTATION:  PEG tube placement  HISTORY OF PRESENT ILLNESS: Mercedes Jackson is an 81 year old Caucasian female who had just been admitted from 02/27 through 03/04  for hyponatremia and dehydration. She has a significant past medical history of laryngeal cancer with metastasis to the lymph nodes. She has undergone chemotherapy as well as radiation therapy and is under the care of  Dr. Oliva Bustard.  Additionally, medical history is significant for hypertension, fall and suffered rib fractures January of this year, possible Alzheimer's dementia, and gastroesophageal reflux disease. The patient is very hard of hearing. Most of the history is obtained from her granddaughter who is present during interviewing process.  She states that she had a substantial 20-pound weight loss at least over the past couple of months. The patient after being initially diagnosed with laryngeal cancer in the 1990s knew what to eat and altered her diet appropriately. But, unfortunately, this has gotten more difficult over time. Concern with aspiration. When she presented to the Emergency Room on 06/01/2011 she was found to have a significant urinary tract infection as well as early right lower lobe pneumonia. She was admitted with SIRS due to urinary tract infection and  pneumonia. No vomiting, but significant for nausea according to her granddaughter as soon as she eats something. No rectal bleeding. No melena that her family is aware of. Other than what is stated, difficult to obtain complete accurate history.   PAST MEDICAL HISTORY:  1. Laryngeal cancer with metastasis to lymph nodes, followed by Dr. Oliva Bustard. 2. Hypertension. 3. Sustained a fall and suffered  rib fractures in January of this year.  4. Hyponatremia.  5. Acute gastroenteritis 05/19/2011.  6. Possible dementia, likely Alzheimer's. 7. Gastroesophageal reflux disease.   PAST SURGICAL HISTORY: Hysterectomy.    FAMILY HISTORY: Noncontributory.   SOCIAL HISTORY: From [facility]: No tobacco. No alcohol.   MEDICATIONS: According to nursing home note: 1. Delsym 30 mg/5 mL, 5 mL twice a day.  2. Diovan 160 mg daily.  3. Duke's Magic mouthwash 5 mL  4 times a day.  4. Ensure liquid twice a day.  5. Lidoderm patch 5% daily.  6. Megace 10 mL, 400 mg daily.  7. Potassium chloride 20 mEq a day.  8. Protonix 40 mg daily.  9. Trazodone 100 mg at bedtime.  10. Ultram 50 mg every six hours as needed for pain.   ALLERGIES: Ibuprofen, Percocet, sulfa, Tylenol.  REVIEW OF SYSTEMS: Limited given the patient being a poor historian and vague. CONSTITUTIONAL: Significant for fatigue, weakness, and weight loss. EYES: No blurred vision. No history of glaucoma. ENT: No ear pain or tinnitus. Significant for hearing loss.   RESPIRATORY: Significant for cough. No hemoptysis. CARDIOVASCULAR: No chest pain.  GI: See history of present illness. GU: Significant for hematuria, admitted with SIRS due to  urinary tract infection and pneumonia. ENDOCRINE: No polyuria or nocturia. HEMATOLOGIC: Denies significant easy bruising and bleeding. SKIN: No rashes. No lesions.  MUSCULOSKELETAL: Significant for arthritis. NEUROLOGIC: No history of cerebrovascular accident or transient ischemic attack. PSYCH: Significant for dementia.   PHYSICAL EXAMINATION:  VITAL SIGNS: Temperature 97.6 with a pulse of 67, respirations 18, blood pressure 144/66, pulse oximetry 93% on room air.  GENERAL: Well-developed, malnourished 81 year old Caucasian female, no acute distress noted though evidence of dementia and being extremely hard of hearing. Granddaughter and friend present during interviewing process.   HEENT:  Normocephalic. Evidence of malnutrition. Sunken cheeks. Atraumatic. Pupils are equal, reactive to light. Conjunctivae clear. Sclerae anicteric.   NECK: Supple. Trachea midline. No lymphadenopathy or thyromegaly.   PULMONARY: Symmetric rise and fall of chest. Clear to auscultation throughout.   CARDIOVASCULAR: Regular rhythm, S1, S2. No murmurs, no gallops.   ABDOMEN: Soft, nondistended. Hypoactive bowel sounds. Mild discomfort throughout entire abdomen. Large incisional scar noted initially midline to suprapubic area and then veers to the left lower quadrant. No evidence of hepatosplenomegaly or masses.   RECTAL: Deferred.   GU: Evidence of Foley catheter in place. Gross evidence of hematuria noted in Foley catheter.   MUSCULOSKELETAL: No contractures. No clubbing.  EXTREMITIES: No edema.   SKIN: Color pale. No lesions. No rashes.   PSYCH: Alert, oriented to name. Given dementia difficult to truly assess.   NEUROLOGICAL: No gross neurological deficits.  LABORATORY, DIAGNOSTIC, AND RADIOLOGICAL DATA: Chemistry panel on admission revealed glucose elevated at 130, BUN 93, creatinine was 1.32, sodium was 153, chloride was 116, eGFR 41. Comparison today: Glucose improved to 65, BUN remains elevated but improved at 33, sodium 150, chloride 118, and eGFR improved greater than 60. Hepatic panel on admission: Total protein 8.3 with an albumin 2.6, AST 11. CBC: WBC count elevated at 16.2 on admission and platelet count 147,000. RDW 15.4. WBC count improved to 12.2 today. Hemoglobin declined from 13.8 to 11.7. RDW remains elevated at 15.5. Lymphocyte number elevated at 10.8. Urine culture revealed greater than 100,000 CFU/mL of Proteus vulgaris group/Proteus pen.  Blood culture no growth in 48 hours. Urinalysis revealed protein 100 mg per deciliter, nitrite positive, leukocytes +1, RBCs 18 per high-power field, WBCs 147 per high-power field, and +1 bacteria ABGs on admission: pH 7.5, pCO2 27, pO2 69,  and bicarbonate 21.1.   EKG: Sinus tachycardia. Chest x-ray PA and lateral on admission revealed right basilar airspace disease concerning for pneumonia. A KUB on the 19th  revealed no evidence of bowel obstruction, relative opacity of gas-filled loops of the bowel in the pelvis and small tissue density suggestive of either a distended urinary bladder or pelvic mass of other etiology.   IMPRESSION:  1. Malnutrition, significant for a 20-pound weight loss over the past 1 to 2 months, significant medical history of laryngeal cancer with metastasis to lymph nodes.  2. Admission for SIRS due to urinary tract infection as well as pneumonia.  3. GI consultation for PEG tube placement.   PLAN: The patient's presentation was discussed with Dr. Verdie Shire. We will proceed with PEG tube placement tomorrow. Order placed to remain on n.p.o. status at this time. Ancef 1 gram IV ordered on call. We will continue to monitor the patient's status.   These services were provided by Ebony Cargo, NP in collaborative agreement with Dr. Verdie Shire.   ____________________________ Payton Emerald, NP dsh:bjt D: 06/04/2011 16:54:14 ET T: 06/04/2011 17:50:53 ET JOB#: [id number]  cc: Payton Emerald, NP, <Dictator> Payton Emerald MD ELECTRONICALLY SIGNED 06/06/2011 17:45

## 2014-07-09 NOTE — Consult Note (Signed)
Pt seen and examined. See Dawn Harrison's notes. Abd scar from hysterectomy. Plan for PEG placement tomorrow afternoon.  Electronic Signatures: Lutricia Feilh, Aeneas Longsworth (MD)  (Signed on 20-Mar-13 16:40)  Authored  Last Updated: 20-Mar-13 16:40 by Lutricia Feilh, Hobie Kohles (MD)

## 2014-07-09 NOTE — Consult Note (Signed)
Brief Consult Note: Diagnosis: Malnutrition.  Weight loss of 20 pounds over the past one to two months.  GI consultation for PEG tube placement.  Admitted for SIRS due to UTI and pneumonia.  Known history of Laryngeal cancer.   Consult note dictated.   Discussed with Attending MD.   Comments: Patient's presentation discussed with Dr. Lutricia FeilPaul Oh.  Recommendation is to proceed with PEG tube placement tomorrow.  Order placed.  NPO status to remain in place.  Ancef 1 gram IV on call order placed.  Will continue to monitor.  Electronic Signatures: Rodman KeyHarrison, Lindwood Mogel S (NP)  (Signed 20-Mar-13 16:56)  Authored: Brief Consult Note   Last Updated: 20-Mar-13 16:56 by Rodman KeyHarrison, Maleya Leever S (NP)

## 2014-07-09 NOTE — Consult Note (Signed)
I had talked to family earlier regarding PEG placement. Pt now on 6l of O2. Anesthesia concerned about resp status. Talked to son about intubation if resp status worsens during procedure. Son does not want patient to be intubated. Thus, does not want to proceed with EGD at this time. Son asked about alternatives. One option is Dubhoff tube placement, which is only short term solution for nutritional support. After discussion with hospitalist, son decided to go ahead with PEG placement. Son knows the risks but patient needs feeding. Will give conscious sedation ourselves only.  Electronic Signatures: Lutricia Feilh, Osceola Holian (MD)  (Signed on 21-Mar-13 16:34)  Authored  Last Updated: 21-Mar-13 16:34 by Lutricia Feilh, Carle Dargan (MD)

## 2014-07-09 NOTE — Consult Note (Signed)
Attempted to place PEG tube. Even with minimal sedation, 2mg  versed and 25ug fentanyl, O2 sat marginal at best. Pt also too restless to proceed. Pt has to stay still for us to do the procedure.Therefore, elected to stop. Discussed case with Dr. Renae GlossWieting. Thanks  Electronic Signatures: Lutricia Feilh, Racquelle Hyser (MD)  (Signed on 21-Mar-13 17:10)  Authored  Last Updated: 21-Mar-13 17:10 by Lutricia Feilh, Ashir Kunz (MD)

## 2014-07-09 NOTE — Discharge Summary (Signed)
PATIENT NAME:  Mercedes Jackson, Mercedes Jackson MR#:  [id number] DATE OF BIRTH:  [birth year]  DATE OF ADMISSION:  06/01/2011 DATE OF DISCHARGE:  06/10/2011  PLEASE NOTE THIS IS AN ADDENDUM TO THE INTERIM DISCHARGE SUMMARY DONE BY MYSELF WHICH COVERS EVENTS FROM 06/01/2011 TO 06/09/2011.   ADMITTING DIAGNOSES: Inability to swallow, nausea, vomiting, failure to thrive.   DISCHARGE DIAGNOSES:  1. Failure to thrive. 2. Dehydration. 3. Difficulty swallowing. 4. Respiratory failure.  5. Pneumonia. 6. Rib fracture.  7. Recent urinary tract infection due to Proteus. 8. Hypernatremia due to dehydration.  9. Hyponatremia.  10. Hypokalemia.  11. Hypomagnesemia due to failure to thrive. 12. Hypertension. 13. Gastroesophageal reflux disease. 14. Dysphagia.  15. Recurrent aspiration. 16. Laryngeal carcinoma. 17. Systemic inflammatory response syndrome.   ADDITIONAL CONSULTANTS: No additional consultants.   LABORATORY, DIAGNOSTIC AND RADIOLOGICAL DATA: Tests done additionally since previous interim summary 06/10/2011: Chest x-ray showed thickening of the interstitial lung markings bilaterally compatible with interstitial pneumonia, atelectasis. No consolidative pulmonary infiltrates are seen.  HOSPITAL COURSE: From 06/09/2011 to 06/10/2011 worsen decline with her mental status and her respiratory status.  1. Continue for systemic inflammatory response syndrome with IV Zosyn, Levaquin, respiratory failure. She started to increase labored breathing requiring oxygen supplementation as high as 5 to 6 liters. We continued her on oxygen supplementation. We are holding her Lasix, Solu-Medrol. She continued to fail swallow evaluation.  2. Urinary tract infection. She was continued on Levaquin.  3. Hypernatremia. Patient actually became hyponatremic. She was receiving TPN, D5 water initially.  4. Hypokalemia, hypomagnesemia. This was repleted.  5. Hypertension. She is receiving Jackson.r.n. hydralazine IV as she was not taking  Jackson.o. medications.  6. Gastroesophageal reflux disease. Patient was on Protonix.   7. Dysphagia, failure to thrive. Patient continued to fail swallow evaluations. Is being evaluated for PEG tube which she was not able to get due to her respiratory status. 8. Had long discussions with Dr. Doylene Canninghoksi, case management, palliative care with family, daughter Steward Jackson and son Mercedes Jackson, in addition, neighbors Su LeyMarilyn Oldham and Janalee DaneGail McCann. Family and friends decided to make the patient DO NOT RESUSCITATE and hospice and did not want to pursue PEG tube. They wanted to allow her to eat with the risk of aspiration. She was subsequently seen by hospice, Ginny Ward and was sent to hospice home on 06/10/2011.   DISCHARGE MEDICATIONS:  1. 6 liters nasal cannula oxygen.  2. Foley was in. 3. Roxanol 20 mg/ mL 0.25 mg 0.5 mL Jackson.o. sublingual every 1 to 2 hours Jackson.r.n. pain, dyspnea until relief.  4. Lorazepam 0.5 to 1.0 mL Jackson.o. sublingual every 2 to 4 hours Jackson.r.n. agitation, anxiety. 5. Ranitidine 150 mg Jackson.o. b.i.d.  6. ABHR suppository.(Ativan 0.5 mg./Benadryl 12.5 mg./Haldol 0.5 mg./Reglan 10 mg) one PR every 4 to 6 hours Jackson.r.n. nonobstructive refractory nausea, vomiting.   CODE STATUS: DO NOT RESUSCITATE.   Thank you for allowing me to participate in the care of this patient.    TOTAL TIME SPENT ON DISCHARGE: 40 minutes.    ____________________________ Corie ChiquitoAmir A. Lafayette DragonFirozvi, MD aaf:cms D: 06/11/2011 14:34:39 ET T: 06/12/2011 10:48:48 ET JOB#: [id number]  cc: Karolee OhsAmir A. Lafayette DragonFirozvi, MD, <Dictator> Gerome SamJanak K. Doylene Canninghoksi, MD Ezzard StandingPaul Y. Bluford Kaufmannh, MD Ned GraceNancy Phifer, MD Serita ShellerErnest B. Maryellen PileEason, MD Coralyn PearAMIR A Orlin Kann MD ELECTRONICALLY SIGNED 06/12/2011 13:30
